# Patient Record
Sex: Male | Born: 1985 | Race: Black or African American | Hispanic: No | Marital: Single
Health system: Southern US, Community
[De-identification: ages and names within clinical notes are randomized; demographics above are authoritative.]

## PROBLEM LIST (undated history)

## (undated) DIAGNOSIS — D573 Sickle-cell trait: Secondary | ICD-10-CM

## (undated) HISTORY — PX: THROAT SURGERY: SHX803

---

## 2017-06-08 ENCOUNTER — Emergency Department
Admission: EM | Admit: 2017-06-08 | Discharge: 2017-06-08 | Disposition: A | Discharge status: Home / Self Care | Payer: Self-pay | Attending: Emergency Medicine | Admitting: Emergency Medicine

## 2017-06-08 ENCOUNTER — Emergency Department: Payer: Self-pay

## 2017-06-08 ENCOUNTER — Other Ambulatory Visit: Payer: Self-pay

## 2017-06-08 ENCOUNTER — Encounter: Payer: Self-pay | Admitting: Emergency Medicine

## 2017-06-08 DIAGNOSIS — R0602 Shortness of breath: Secondary | ICD-10-CM | POA: Insufficient documentation

## 2017-06-08 DIAGNOSIS — F10920 Alcohol use, unspecified with intoxication, uncomplicated: Secondary | ICD-10-CM | POA: Insufficient documentation

## 2017-06-08 DIAGNOSIS — F1210 Cannabis abuse, uncomplicated: Secondary | ICD-10-CM | POA: Insufficient documentation

## 2017-06-08 DIAGNOSIS — R079 Chest pain, unspecified: Secondary | ICD-10-CM | POA: Insufficient documentation

## 2017-06-08 DIAGNOSIS — M25552 Pain in left hip: Secondary | ICD-10-CM | POA: Insufficient documentation

## 2017-06-08 HISTORY — DX: Sickle-cell trait: D57.3

## 2017-06-08 LAB — CBC
HEMATOCRIT: 45 % (ref 40.0–52.0)
HEMOGLOBIN: 15.2 g/dL (ref 13.0–18.0)
MCH: 28.7 pg (ref 26.0–34.0)
MCHC: 33.8 g/dL (ref 32.0–36.0)
MCV: 85 fL (ref 80.0–100.0)
Platelets: 250 10*3/uL (ref 150–440)
RBC: 5.3 MIL/uL (ref 4.40–5.90)
RDW: 13.5 % (ref 11.5–14.5)
WBC: 10.2 10*3/uL (ref 3.8–10.6)

## 2017-06-08 LAB — URINE DRUG SCREEN, QUALITATIVE (ARMC ONLY)
Amphetamines, Ur Screen: NOT DETECTED
BENZODIAZEPINE, UR SCRN: NOT DETECTED
Barbiturates, Ur Screen: NOT DETECTED
CANNABINOID 50 NG, UR ~~LOC~~: POSITIVE — AB
Cocaine Metabolite,Ur ~~LOC~~: NOT DETECTED
MDMA (ECSTASY) UR SCREEN: NOT DETECTED
Methadone Scn, Ur: NOT DETECTED
Opiate, Ur Screen: NOT DETECTED
PHENCYCLIDINE (PCP) UR S: NOT DETECTED
Tricyclic, Ur Screen: NOT DETECTED

## 2017-06-08 LAB — COMPREHENSIVE METABOLIC PANEL
ALBUMIN: 4.8 g/dL (ref 3.5–5.0)
ALK PHOS: 47 U/L (ref 38–126)
ALT: 35 U/L (ref 17–63)
AST: 40 U/L (ref 15–41)
Anion gap: 13 (ref 5–15)
BILIRUBIN TOTAL: 0.6 mg/dL (ref 0.3–1.2)
BUN: 11 mg/dL (ref 6–20)
CALCIUM: 9.2 mg/dL (ref 8.9–10.3)
CO2: 22 mmol/L (ref 22–32)
CREATININE: 1.06 mg/dL (ref 0.61–1.24)
Chloride: 107 mmol/L (ref 101–111)
GFR calc Af Amer: >60 mL/min (ref >60)
GFR calc non Af Amer: >60 mL/min (ref >60)
GLUCOSE: 87 mg/dL (ref 65–99)
Potassium: 4 mmol/L (ref 3.5–5.1)
Sodium: 142 mmol/L (ref 135–145)
TOTAL PROTEIN: 8 g/dL (ref 6.5–8.1)

## 2017-06-08 LAB — TROPONIN I

## 2017-06-08 LAB — ETHANOL: Alcohol, Ethyl (B): 153 mg/dL — ABNORMAL HIGH (ref <10)

## 2017-06-08 MED ORDER — KETOROLAC TROMETHAMINE 30 MG/ML IJ SOLN
30.0000 mg | Freq: Once | INTRAMUSCULAR | Status: AC
Start: 2017-06-08 — End: 2017-06-08
  Administered 2017-06-08: 30 mg via INTRAVENOUS
  Filled 2017-06-08: qty 1

## 2017-06-08 NOTE — ED Provider Notes (Signed)
[facility] Emergency Department Provider Note   First MD Initiated Contact with Patient 06/08/17 [phone]     (approximate)  I have reviewed the triage vital signs and the nursing notes.   HISTORY  Chief Complaint Shortness of Breath   HPI William Brock is a 32 y.o. male presents to the emergency department via EMS with history of left-sided chest discomfort/back accompanied by shortness of breath which began following an argument with his girlfriend.  Patient states that he has been walking in the rain for approximately 1 hour following that argument and acutely started experiencing dyspnea and said pain.  Patient does admit to EtOH ingestion tonight stating that "I did not even drink an entire 40 ounce".  Patient does admit to tobacco use daily.  Patient denies any lower extremity pain or swelling.  Patient denies any history of patient denies any cardiac disease.  Past Medical History:  Diagnosis Date  . Sickle-cell trait (HCC)     There are no active problems to display for this patient.   Past Surgical History:  Procedure Laterality Date  . THROAT SURGERY     due to abcess     Prior to Admission medications   Not on File    Allergies Penicillins  No family history on file.  Social History Social History   Tobacco Use  . Smoking status: Not on file  Substance Use Topics  . Alcohol use: Not on file  . Drug use: Not on file    Review of Systems Constitutional: No fever/chills Eyes: No visual changes. ENT: No sore throat. Cardiovascular: Positive for chest pain. Respiratory: Positive for shortness of breath. Gastrointestinal: No abdominal pain.  No nausea, no vomiting.  No diarrhea.  No constipation. Genitourinary: Negative for dysuria. Musculoskeletal: Negative for neck pain.  Negative for back pain. Integumentary: Negative for rash. Neurological: Negative for headaches, focal weakness or  numbness.   ____________________________________________   PHYSICAL EXAM:  VITAL SIGNS: ED Triage Vitals  Enc Vitals Group     BP 06/08/17 0502 (!) 134/101     Pulse Rate 06/08/17 0502 (!) 108     Resp 06/08/17 0502 14     Temp 06/08/17 0502 98.1 F (36.7 C)     Temp Source 06/08/17 0502 Oral     SpO2 --      Weight 06/08/17 0502 81.6 kg (180 lb)     Height 06/08/17 0502 1.778 m (5\' 10" )     Head Circumference --      Peak Flow --      Pain Score 06/08/17 0501 7     Pain Loc --      Pain Edu? --      Excl. in GC? --     Constitutional: Alert and oriented. Well appearing and in no acute distress. Eyes: Conjunctivae are normal. PERRL. EOMI. Head: Atraumatic. Mouth/Throat: Mucous membranes are moist. Oropharynx non-erythematous. Neck: No stridor.  Cardiovascular: Normal rate, regular rhythm. Good peripheral circulation. Grossly normal heart sounds. Respiratory: Normal respiratory effort.  No retractions. Lungs CTAB. Gastrointestinal: Soft and nontender. No distention.  Musculoskeletal: No lower extremity tenderness nor edema. No gross deformities of extremities. Neurologic:  Normal speech and language. No gross focal neurologic deficits are appreciated.  Skin:  Skin is warm, dry and intact. No rash noted. Psychiatric: Mood and affect are normal. Speech and behavior are normal.  ____________________________________________   LABS (all labs ordered are listed, but only abnormal results are displayed)  Labs Reviewed  CBC  ETHANOL  TROPONIN I  COMPREHENSIVE METABOLIC PANEL  URINE DRUG SCREEN, QUALITATIVE ([facility] ONLY)   ____________________________________________  EKG  ED ECG REPORT I, [place] N Saintclair Schroader, the attending physician, personally viewed and interpreted this ECG.   Date: 06/08/2017  EKG Time: 4:59 AM  Rate: 109  Rhythm: Sinus tachycardia  Axis: Normal  Intervals: Normal  ST&T Change:  None  ____________________________________________  RADIOLOGY I, [place] Lyrick Lagrand, personally viewed and evaluated these images (plain radiographs) as part of my medical decision making, as well as reviewing the written report by the radiologist.  ED MD interpretation: No acute cardiopulmonary findings on chest x-ray  Official radiology report(s): Dg Chest Portable 1 View  Result Date: 06/08/2017 CLINICAL DATA:  Acute onset shortness of breath after argument. EXAM: PORTABLE CHEST 1 VIEW COMPARISON:  None. FINDINGS: Cardiomediastinal silhouette is normal. No pleural effusions or focal consolidations. Trachea projects midline and there is no pneumothorax. Soft tissue planes and included osseous structures are non-suspicious. IMPRESSION: Normal chest. Electronically Signed   By: Awilda Metro M.D.   On: 06/08/2017 05:21      Procedures   ____________________________________________   INITIAL IMPRESSION / ASSESSMENT AND PLAN / ED COURSE  As part of my medical decision making, I reviewed the following data within the electronic MEDICAL RECORD NUMBER   32 year old male presented with above-stated history of dyspnea.  Consider possibly PE or CAD however I suspect this to be  unlikely.  EKG revealed no abnormality laboratory likewise unremarkable with the exception of the fact that the patient is intoxicated with an EtOH level of 153 and urine drug screen positive for cannabinoids.  Chest x-ray revealed no acute cardiopulmonary abnormality.  When I returned to the room to inform the patient of his chest x-ray and laboratory findings.  Patient states dyspnea has resolved and now complains of left hip pain that is nontraumatic in nature. ___________________________________________  FINAL CLINICAL IMPRESSION(S) / ED DIAGNOSES  Final diagnoses:  SOB (shortness of breath)     MEDICATIONS GIVEN DURING THIS VISIT:  Medications - No data to display   ED Discharge Orders    None        Note:  This document was prepared using Dragon voice recognition software and may include unintentional dictation errors.    Darci Current, MD 06/08/17 0700

## 2017-06-08 NOTE — ED Notes (Signed)
Patient is no longer short of breath but now states that hip hurts

## 2017-06-08 NOTE — ED Notes (Signed)

## 2017-06-08 NOTE — ED Triage Notes (Signed)
Patient walked for about an hour in the rain after argument wity girlfriend and had sudden onset of shortness of breath on side and back. Patient states he only had a little alcohol but smells very strongly of alcohol.

## 2019-09-19 IMAGING — DX DG CHEST 1V PORT
1 series · 1 of 1 positions shown · non-contrast
Comparison: None.

CLINICAL DATA: Acute onset shortness of breath after argument.

EXAM:
PORTABLE CHEST 1 VIEW

[chest ap]
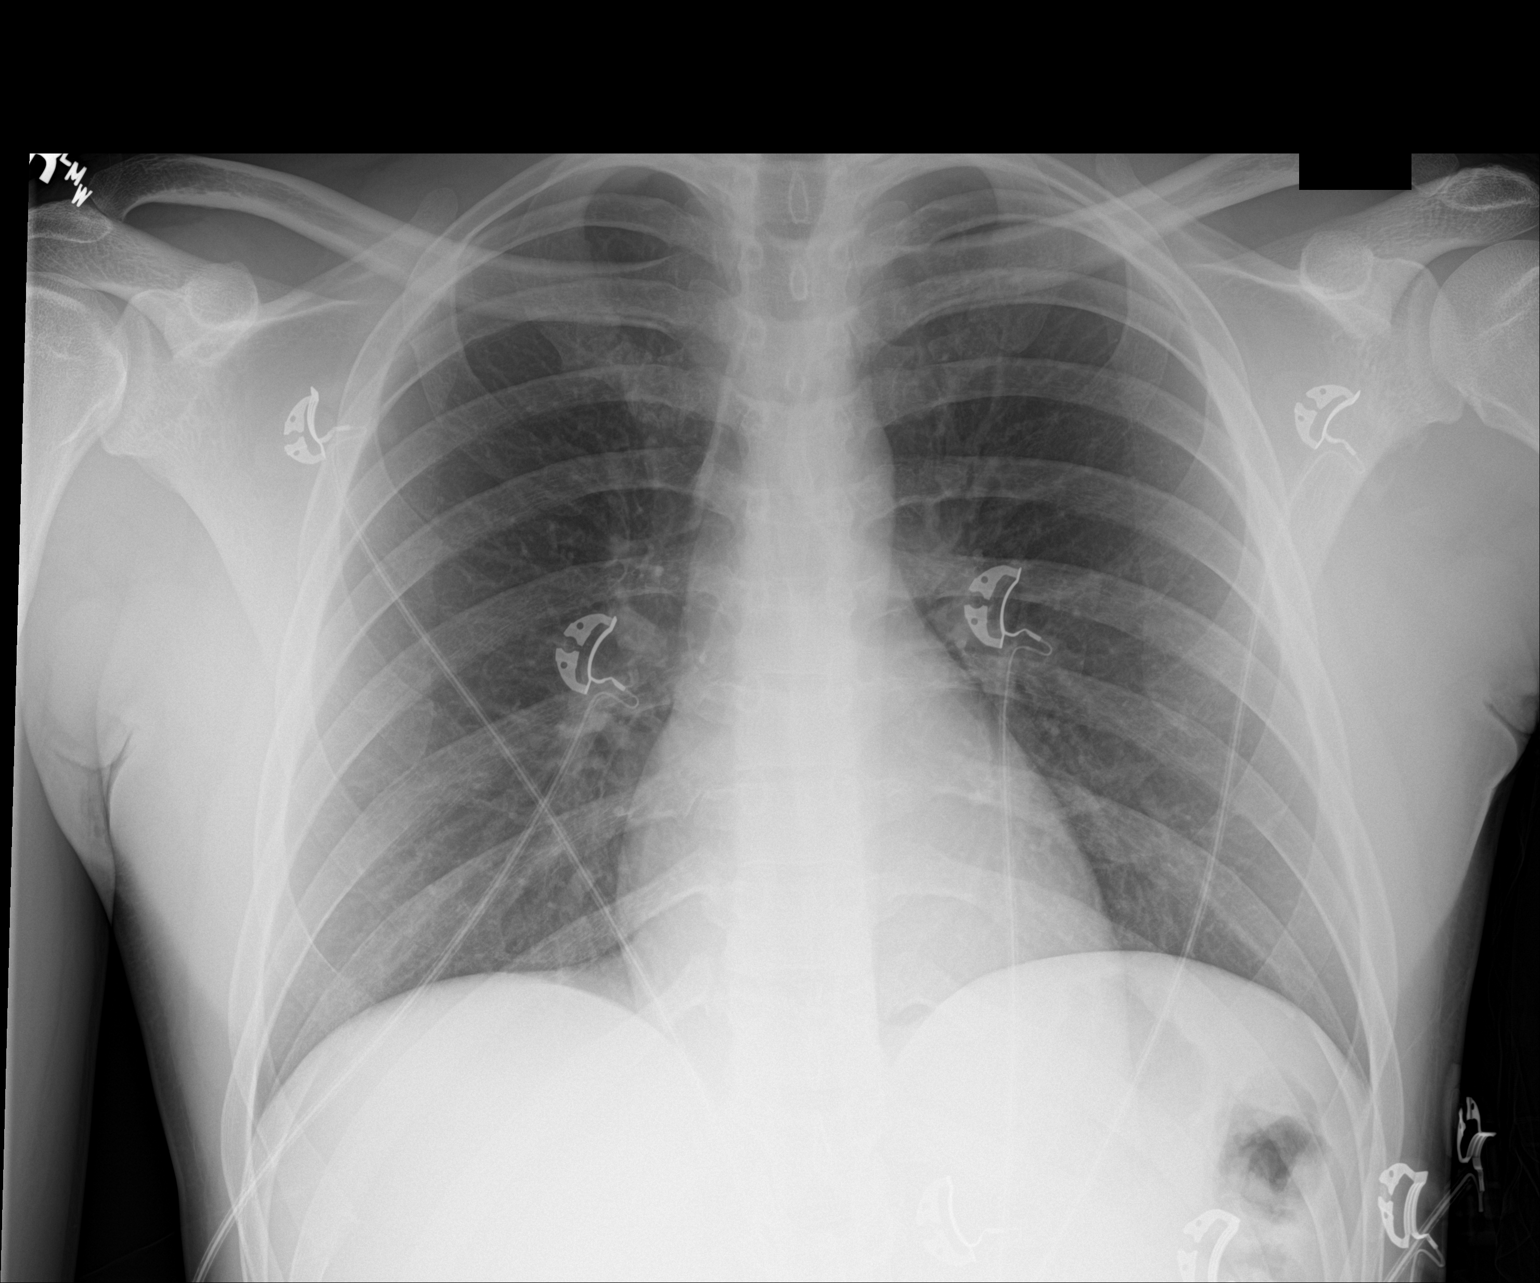

[1 of 1 positions shown; findings below may reference images not displayed]

FINDINGS: Cardiomediastinal silhouette is normal. No pleural effusions or
focal consolidations. Trachea projects midline and there is no
pneumothorax. Soft tissue planes and included osseous structures are
non-suspicious.
IMPRESSION: Normal chest.
